# Patient Record
Sex: Female | Born: 1980 | Race: Black or African American | Hispanic: No | Marital: Single | Smoking: Never smoker
Health system: Southern US, Community
[De-identification: ages and names within clinical notes are randomized; demographics above are authoritative.]

## PROBLEM LIST (undated history)

## (undated) HISTORY — PX: FOOT SURGERY: SHX648

---
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
## 2000-09-16 ENCOUNTER — Emergency Department (HOSPITAL_COMMUNITY): Admission: EM | Admit: 2000-09-16 | Discharge: 2000-09-16 | Payer: Self-pay

## 2001-10-07 ENCOUNTER — Inpatient Hospital Stay (HOSPITAL_COMMUNITY): Admission: AD | Admit: 2001-10-07 | Discharge: 2001-10-12 | Payer: Self-pay | Admitting: Obstetrics and Gynecology

## 2001-10-07 ENCOUNTER — Encounter: Payer: Self-pay | Admitting: Emergency Medicine

## 2001-10-08 ENCOUNTER — Encounter: Payer: Self-pay | Admitting: Obstetrics and Gynecology

## 2001-10-12 ENCOUNTER — Inpatient Hospital Stay (HOSPITAL_COMMUNITY): Admission: AD | Admit: 2001-10-12 | Discharge: 2001-10-15 | Payer: Self-pay | Admitting: Obstetrics and Gynecology

## 2002-10-25 ENCOUNTER — Emergency Department (HOSPITAL_COMMUNITY): Admission: EM | Admit: 2002-10-25 | Discharge: 2002-10-25 | Payer: Self-pay | Admitting: Emergency Medicine

## 2002-11-20 ENCOUNTER — Emergency Department (HOSPITAL_COMMUNITY): Admission: EM | Admit: 2002-11-20 | Discharge: 2002-11-20 | Payer: Self-pay | Admitting: Emergency Medicine

## 2005-06-07 ENCOUNTER — Emergency Department (HOSPITAL_COMMUNITY): Admission: EM | Admit: 2005-06-07 | Discharge: 2005-06-07 | Payer: Self-pay | Admitting: Emergency Medicine

## 2006-06-22 ENCOUNTER — Ambulatory Visit: Payer: Self-pay | Admitting: Family Medicine

## 2006-09-01 ENCOUNTER — Emergency Department (HOSPITAL_COMMUNITY): Admission: EM | Admit: 2006-09-01 | Discharge: 2006-09-02 | Payer: Self-pay | Admitting: Emergency Medicine

## 2007-01-15 ENCOUNTER — Ambulatory Visit: Payer: Self-pay | Admitting: Family Medicine

## 2007-01-30 ENCOUNTER — Emergency Department (HOSPITAL_COMMUNITY): Admission: EM | Admit: 2007-01-30 | Discharge: 2007-01-30 | Payer: Self-pay | Admitting: Emergency Medicine

## 2007-02-02 ENCOUNTER — Ambulatory Visit (HOSPITAL_BASED_OUTPATIENT_CLINIC_OR_DEPARTMENT_OTHER): Admission: RE | Admit: 2007-02-02 | Discharge: 2007-02-02 | Payer: Self-pay | Admitting: Orthopedic Surgery

## 2007-08-27 ENCOUNTER — Ambulatory Visit: Payer: Self-pay | Admitting: Family Medicine

## 2008-02-08 ENCOUNTER — Ambulatory Visit: Payer: Self-pay | Admitting: Family Medicine

## 2008-05-18 ENCOUNTER — Ambulatory Visit: Payer: Self-pay | Admitting: Family Medicine

## 2008-08-08 ENCOUNTER — Ambulatory Visit: Payer: Self-pay | Admitting: Family Medicine

## 2008-09-02 ENCOUNTER — Encounter: Admission: RE | Admit: 2008-09-02 | Discharge: 2008-09-02 | Payer: Self-pay | Admitting: Orthopedic Surgery

## 2009-01-10 ENCOUNTER — Emergency Department (HOSPITAL_COMMUNITY): Admission: EM | Admit: 2009-01-10 | Discharge: 2009-01-10 | Payer: Self-pay | Admitting: Emergency Medicine

## 2010-06-25 NOTE — Op Note (Signed)
[name], [name]                 ACCOUNT NO.:  [id number]   MEDICAL RECORD NO.:  [id number]          PATIENT TYPE:  AMB   LOCATION:  [facility]                         FACILITY:  [facility]   PHYSICIAN:  Marlowe Kays, M.D.  DATE OF BIRTH:  07/29/1980   DATE OF PROCEDURE:  02/02/2007  DATE OF DISCHARGE:  01/30/2007                               OPERATIVE REPORT   PREOPERATIVE DIAGNOSIS:  Painful growing mass, right second toe.   POSTOPERATIVE DIAGNOSIS:  Cyst of flexor tendon sheath, right second  toe.   OPERATION:  Excision of cyst flexor tendon sheath, right second toe.   SURGEON:  Marlowe Kays, M.D.   ASSISTANTDruscilla Brownie. Underwood, P.A.-C.   ANESTHESIA:  General.   JUSTIFICATION FOR PROCEDURE:  This mass has been present for at least a  month and is progressively growing and painful.  X-rays are  unremarkable.  Differential diagnosis was cyst versus possible giant  cell tumor.  Mr. Angie Fava services were utilized because of the  patient's massive obesity and the need for two pairs of hands for  retraction, with one being the scrub nurse.   DESCRIPTION OF PROCEDURE:  Satisfactory general anesthesia, left leg was  supported with taping and an OR table.  The right leg had an Esmarch  applied with the tourniquet inflated to 400 mmHg.  The foot and ankle  were prepped with DuraPrep and draped in a sterile field.  A time out  was performed.  I made a triangular shaped incision with the apex of the  triangle centered at the DIP joint on the medial side and the two arms  of the triangle lateral proximal and distal.  Working in the midline on  the more medial side, I was able to work through the subcutaneous tissue  and find the mass, which was a cyst.  I dissected this off the  underlying flexor tendon with a combination of sharp dissection and a  Therapist, nutritional, incising it at its base.  It was clearly benign, I did  not send it to pathology.  The small defect in the flexor  tendon sheath  was left open.  There was no harm to the tendon and the neurovascular  structures were protected to either side.  I blocked the toe with 0.5%  plain Marcaine and closed the skin and subcutaneous tissue as a unit  with interrupted 4-0 nylon sutures.  Betadine and Adaptic dry, sterile  dressing were applied, the tourniquet was released, she tolerated the  procedure well and was taken to the recovery room in satisfactory  condition with no complications.           ______________________________  Marlowe Kays, M.D.     JA/MEDQ  D:  02/02/2007  T:  02/02/2007  Job:  [id number]

## 2010-06-28 NOTE — Discharge Summary (Signed)
NAMESAMAH, LAPIANA                             ACCOUNT NO.:  [id number]   MEDICAL RECORD NO.:  [id number]                   PATIENT TYPE:   LOCATION:                                       FACILITY:   PHYSICIAN:  Conni Elliot, M.D.             DATE OF BIRTH:  21-Nov-1980   DATE OF ADMISSION:  10/12/2001  DATE OF DISCHARGE:  10/15/2001                                 DISCHARGE SUMMARY   BRIEF ADMISSION HISTORY AND PHYSICAL:  The patient is a 30 year old G1, P1  who was postoperative day number three after a low transverse cesarean  section with preeclampsia who presented to [facility] on the day of admission after  being discharged earlier that day with complaints of severe frontal headache  and dizziness.  She denied any visual changes.  On examination she was found  to be afebrile with elevated blood pressures of 159/106 and 185/93.  She was  an obese African-American female with a soft, nontender abdomen and incision  that was clean, dry, and intact.  She was found to have 2+ lower extremity  edema, 3+ DTRs, as well as some generalized edema.  Laboratory work showed a  uric acid of 5.5 and AST of 38 which was elevated, a normal ALT, and  elevated LDH of 367.  Platelets were 248,000 and hemoglobin was 7.6.  The  patient was readmitted and placed on magnesium for preeclampsia with  elevated blood pressure with neurologic symptoms as well as the  elevated  labs.   HOSPITAL COURSE:  On hospital day number two which was postoperative day  number four the patient was without any PIH symptoms including headache  which had resolved after her magnesium bolus at admission.  Her blood  pressures ranged from systolics of 140-165 with diastolics ranging from 70s-  90s.  Magnesium level was obtained and found to be 3.6 so her magnesium was  rebolused at 1 g and then increased to 3 g/hour.  She began to have good  diuresis and improvement in her blood pressures and remained asymptomatic.  Her PIH  laboratories showed improvement on hospital day number three which  is postoperative day number five so her magnesium was discontinued.  Her  staples were removed on postoperative day number five.  On hospital day  number four which was postoperative day number six patient remained  asymptomatic with improved blood pressures and PIH laboratories, although  uric acid was slightly elevated at 0.3.  The patient felt much improved and  was deemed ready for discharge.   DISCHARGE MEDICATIONS:  Included the medications she had been prescribed at  her previous discharge which were iron sulfate, Percocet, Motrin, prenatal  vitamins, and Micronor.  She was also given a prescription for Colace 100 mg  p.o. b.i.d. for constipation.   DISCHARGE INSTRUCTIONS:  She was instructed to keep her incisional wound  clean and dry, to refrain  from sexual activity for six weeks, and to follow  up with [facility] in six weeks.  She is also instructed to return to  [facility] if she developed headache, blurry vision, abdominal pain,  or developed problems with her abdominal incision.     Georgina Peer, M.D.                 Conni Elliot, M.D.    JM/MEDQ  D:  12/14/2001  T:  12/15/2001  Job:  [id number]

## 2010-06-28 NOTE — Op Note (Signed)
[name], [name]                           ACCOUNT NO.:  [id number]   MEDICAL RECORD NO.:  [id number]                   PATIENT TYPE:  INP   LOCATION:  9110                                 FACILITY:  [facility]   PHYSICIAN:  Enid Cutter, M.D.                  DATE OF BIRTH:  February 17, 1980   DATE OF PROCEDURE:  10/08/2001  DATE OF DISCHARGE:                                 OPERATIVE REPORT   PREOPERATIVE DIAGNOSIS:  [age]-year-old G1, P0 at term with arrest of  dilatation.   POSTOPERATIVE DIAGNOSIS:  [age]-year-old G1, P0 at term with arrest of  dilatation.   PROCEDURE:  Primary low transverse cesarean section.   SURGEON:  Enid Cutter, M.D.   ANESTHESIA:  Epidural.   COMPLICATIONS:  None.   SPECIMENS:  None.   DISPOSITION:  To recovery room stable.   ESTIMATED BLOOD LOSS:  1000 cc.   FINDINGS:  Viable female infant delivered at [address]16 on October 07, 2001, with  Apgars of 8 and 9,  weighing 7 pounds 7 ounces.   COUNTS:  Sponge, needle and instrument counts were correct at the end of the  procedure.   INDICATIONS FOR PROCEDURE:  The patient is a 30 year old gravida 1 who  presents to [facility] on October 07, 2001.  She did not know that  she was pregnant at that time.  Subsequent examination, pregnancy test and  ultrasound revealed that she was approximately [redacted] weeks pregnant.  She had  spontaneous rupture of membranes at approximately 10 a.m. on October 07, 2001.  She was transferred to [facility] where induction of labor was  begun.  She was managed by [facility] and Gynecology until they  transferred her to the teaching service.  She was then continued on  induction and was begun on Pitocin throughout the day.  She progressed to 4  cm.  She had approximately 290 NPU, however, did not change her cervix past  4 cm over greater than three hours.  Decision was made to proceed with  primary low transverse cesarean section secondary to arrest  of labor.  The  patient was counseled on her risks of surgery including the risks of  bleeding, infection, injury to internal organs, risks of transfusion or  emergency hysterectomy.  The patient understands the risks and desires to  proceed.   DESCRIPTION OF PROCEDURE:  The patient was taken to the operating room where  she was given epidural anesthesia bolus.  She was prepped and draped in a  sterile fashion.  A Pfannenstiel incision was performed and carried down to  the underlying fascia with a scalpel.  The fascia was then entered with the  Bovie and dissected laterally with Mayo scissors.  The fascia was separated  from the underlying rectus muscle bellies, and the peritoneum was entered  sharply.  The bladder flap was created  with sharp and blunt dissection.  The  uterus was scored with the scalpel and carried down to the midline.  The  uterus was entered, and the incision was extended laterally.  The infant's  head was delivered from deep in the pelvis in a transverse presentation.  The infant was bulb suctioned on the operative field, and the body was  delivered.  The cord was clamped and cut, and the infant was handed off to  the awaiting neonatal resuscitation team.  The placenta was manually  extracted.  The uterus was externalized and curetted with a dry lap sponge.  The uterine incision was repaired with a running locking suture of 0  chromic.  There was a small extension on the patient's right corner downward  with delivery of the head.  This extension was continued further, and repair  of this extension was _______with the same stitch.  The cervix and uterus  were noted to be hemostatic, and the uterus was returned to the abdominal  cavity.  Pericolic gutters were emptied of clot and debris, and the uterine  incision was inspected again and noted to be hemostatic.  The fascia was  closed with a running suture of 0 Vicryl.  The subcutaneous tissues were  irrigated copious  amounts of saline, and the skin edges were reapproximated  with skin clips.                                               Enid Cutter, M.D.    EMH/MEDQ  D:  10/09/2001  T:  10/11/2001  Job:  [id number]

## 2010-06-28 NOTE — Discharge Summary (Signed)
NAME:  CHEA, MALAN                           ACCOUNT NO.:  [id number]   MEDICAL RECORD NO.:  [id number]                   PATIENT TYPE:  INP   LOCATION:  9110                                 FACILITY:  [facility]   PHYSICIAN:  Phil D. Okey Dupre, M.D.                  DATE OF BIRTH:  Feb 19, 1980   DATE OF ADMISSION:  10/07/2001  DATE OF DISCHARGE:                                 DISCHARGE SUMMARY   PRIMARY PHYSICIAN:  [facility].   DISCHARGE DIAGNOSES:  1. Status post primary low transverse cesarean section secondary to failure     to dilate.  2. Pregnancy induced hypertension status post magnesium sulfate.  3. Postpartum anemia.  4. Status post delivery of a viable female infant at approximately 37-[redacted] weeks     gestation.   DISCHARGE MEDICATIONS:  1. Ibuprofen 600 mg q.6h. p.r.n. pain.  2. Percocet 5/325 p.o. q.4-6h. p.r.n. severe pain.  3. Micronor starting Sunday, October 24, 2001 - one tablet p.o. q.d. at     the same time q.d.  4. Iron sulfate 325 mg p.o. t.i.d. with meals.  5. Prenatal vitamins one p.o. q.d. x6 weeks or while breast feeding.   FOLLOW-UP:  The patient is to follow up with [facility] at six weeks.   PROCEDURES AND DIAGNOSTIC STUDIES:  Primary LTCS on October 09, 2001 with  subsequent delivery of a viable female infant at 80 on October 09, 2001.   CONSULTATIONS:  None.   ADMISSION HISTORY AND PHYSICAL:  A 30 year old G1 P0 African-American female  at 31-[redacted] weeks gestation who had not previously been diagnosed with  pregnancy who presented with pelvic pressure and a blood pressure of  166/124.  On exam the patient was found to have positive ferning, pooling,  and nitrazine, and on cervical exam was fingertip, 70%, -2, and vertex.  Prenatal labs were obtained as well as a PIH panel which was relatively  within normal limits except for a uric acid of 5.0; negative wet prep on  admission.  Stadol and Phenergan were given for headache and an OB  ultrasound was  obtained.   HOSPITAL COURSE:  The patient progressed through labor, did have an IUPC  placed for low-dose Pitocin augmentation.  The patient was treated with  Stadol and Phenergan for pain which did resolve her headache but did not  help with her contraction pain significantly.  The patient did dilate to 4  cm but did not progress further than that.  The patient was prolonged  rupture at greater than 18 hours, was started on penicillin and then changed  to Unasyn for better antibiotic coverage.  The patient did develop elevated  blood pressures throughout her hospitalization which resulted in magnesium  sulfate for probable PIH.  The patient was taken to the OR and did have a  primary LTCS without difficulty.  The patient had routine postoperative  care.  The only difficulty in the postoperative period was anemia for which  she was treated with iron sulfate, she was not symptomatic, and therefore  was determined to be appropriate for discharge without a transfusion.   DISCHARGE LABORATORY DATA:  Wbc's 11.0, hemoglobin 7.0, hematocrit 21.6,  platelets 210.  Sodium 138, potassium 3.4, chloride 107, CO2 27, glucose 68,  BUN 5, creatinine 0.7, calcium 8.0, total protein 5.9, albumin 2.5, AST 12,  ALT less than 19, alkaline phosphatase 132, total bilirubin 0.1, LDH 212,  magnesium 4.5 on August 31, uric acid 6.4.  Hepatitis B surface antigen  negative.  A positive, antibody negative.  GBS positive.  RPR negative.  GC  negative, chlamydia negative.  Rubella immune.   OB ultrasound on October 08, 2001 showed single IUP, cephalic in position,  low AFI.   DISPOSITION:  The patient was discharged to home on postoperative day #3  without further difficulty.     Jonah Blue, M.D.                      Phil D. Okey Dupre, M.D.    Milas Gain  D:  10/12/2001  T:  10/12/2001  Job:  [id number]   cc:   [facility]

## 2010-11-15 LAB — POCT PREGNANCY, URINE
Operator id: 280881
Preg Test, Ur: NEGATIVE

## 2010-11-15 LAB — POCT HEMOGLOBIN-HEMACUE
Hemoglobin: 10.1 — ABNORMAL LOW
Operator id: 280881

## 2011-01-07 ENCOUNTER — Emergency Department (INDEPENDENT_AMBULATORY_CARE_PROVIDER_SITE_OTHER)

## 2011-01-07 ENCOUNTER — Encounter: Payer: Self-pay | Admitting: Emergency Medicine

## 2011-01-07 ENCOUNTER — Emergency Department (HOSPITAL_BASED_OUTPATIENT_CLINIC_OR_DEPARTMENT_OTHER)
Admission: EM | Admit: 2011-01-07 | Discharge: 2011-01-08 | Disposition: A | Discharge status: Home / Self Care | Attending: Emergency Medicine | Admitting: Emergency Medicine

## 2011-01-07 DIAGNOSIS — S4980XA Other specified injuries of shoulder and upper arm, unspecified arm, initial encounter: Secondary | ICD-10-CM | POA: Insufficient documentation

## 2011-01-07 DIAGNOSIS — M25529 Pain in unspecified elbow: Secondary | ICD-10-CM

## 2011-01-07 DIAGNOSIS — X58XXXA Exposure to other specified factors, initial encounter: Secondary | ICD-10-CM

## 2011-01-07 DIAGNOSIS — M79609 Pain in unspecified limb: Secondary | ICD-10-CM

## 2011-01-07 DIAGNOSIS — S59909A Unspecified injury of unspecified elbow, initial encounter: Secondary | ICD-10-CM

## 2011-01-07 DIAGNOSIS — Y9289 Other specified places as the place of occurrence of the external cause: Secondary | ICD-10-CM | POA: Insufficient documentation

## 2011-01-07 DIAGNOSIS — IMO0002 Reserved for concepts with insufficient information to code with codable children: Secondary | ICD-10-CM | POA: Insufficient documentation

## 2011-01-07 DIAGNOSIS — S4990XA Unspecified injury of shoulder and upper arm, unspecified arm, initial encounter: Secondary | ICD-10-CM

## 2011-01-07 DIAGNOSIS — S46909A Unspecified injury of unspecified muscle, fascia and tendon at shoulder and upper arm level, unspecified arm, initial encounter: Secondary | ICD-10-CM | POA: Insufficient documentation

## 2011-01-07 DIAGNOSIS — S6990XA Unspecified injury of unspecified wrist, hand and finger(s), initial encounter: Secondary | ICD-10-CM

## 2011-01-07 MED ORDER — HYDROCODONE-ACETAMINOPHEN 5-325 MG PO TABS
2.0000 | ORAL_TABLET | Freq: Once | ORAL | Status: AC
  Administered 2011-01-07: 2 via ORAL
  Filled 2011-01-07: qty 2

## 2011-01-07 MED ORDER — HYDROCODONE-ACETAMINOPHEN 5-325 MG PO TABS: 2.0000 | ORAL_TABLET | ORAL | Status: AC | PRN

## 2011-01-07 NOTE — ED Notes (Signed)
Pt c/o left forearm pain after being hit with a piece of equipment at work. Pt has ice on left arm.

## 2011-01-07 NOTE — ED Provider Notes (Signed)
History     CSN: [id number] Arrival date & time: 01/07/2011 10:07 PM   First MD Initiated Contact with Patient 01/07/11 2211      Chief Complaint  Patient presents with  . Arm Injury    (Consider location/radiation/quality/duration/timing/severity/associated sxs/prior treatment) HPI Comments: Patient presents with left forearm and elbow pain after having it closed in a piece of machinery at work.  She states any type of door came down on her left proximal forearm.  His pain it radiates in the entire arm and with flexion of the elbow joint. There is no breaks in the skin she denies any weakness, numbness, tingling. She has full sensation range of motion of her hand.  The history is provided by the patient.    History reviewed. No pertinent past medical history.  Past Surgical History  Procedure Date  . Cesarean section   . Foot surgery     No family history on file.  History  Substance Use Topics  . Smoking status: Never Smoker   . Smokeless tobacco: Not on file  . Alcohol Use: Yes    OB History    Grav Para Term Preterm Abortions TAB SAB Ect Mult Living                  Review of Systems  All other systems reviewed and are negative.    Allergies  Review of patient's allergies indicates no known allergies.  Home Medications   Current Outpatient Rx  Name Route Sig Dispense Refill  . HYDROCODONE-ACETAMINOPHEN 5-325 MG PO TABS Oral Take 2 tablets by mouth every 4 (four) hours as needed for pain. 10 tablet 0    BP 135/74  Pulse 97  Temp(Src) 97.6 F (36.4 C) (Oral)  Resp 18  SpO2 100%  Physical Exam  Constitutional: She is oriented to person, place, and time. She appears well-developed and well-nourished. No distress.  HENT:  Head: Normocephalic and atraumatic.  Mouth/Throat: Oropharynx is clear and moist. No oropharyngeal exudate.  Neck: Normal range of motion.  Pulmonary/Chest: No respiratory distress.  Abdominal: Soft.  Musculoskeletal: Normal  range of motion. She exhibits tenderness.       Erythema and tenderness to the dorsal proximal left forearm. +2 radial pulse. There is a ganglion cyst over the palm are radial wrist.  Cardinal hand movements are intact. She has limited range of motion the left elbow joint is unable to flex it completely.  She does have pain with pronation, supination  Neurological: She is alert and oriented to person, place, and time. No cranial nerve deficit.  Skin: Skin is warm.    ED Course  Procedures (including critical care time)  Labs Reviewed - No data to display Dg Elbow Complete Left  01/07/2011  *RADIOLOGY REPORT*  Clinical Data: Left elbow injury with pain.  LEFT ELBOW - COMPLETE 3+ VIEW  Comparison:  None.  Findings:  There is no evidence of fracture, dislocation, or joint effusion.  There is no evidence of arthropathy or other focal bone abnormality.  Soft tissues are unremarkable.  IMPRESSION: Negative.  Original Report Authenticated By: Reola Calkins, M.D.   Dg Forearm Left  01/07/2011  *RADIOLOGY REPORT*  Clinical Data: Left forearm pain, after injury from equipment at work.  LEFT FOREARM - 2 VIEW  Comparison: Left wrist MRI performed 09/02/2008  Findings: There is no evidence of fracture or dislocation.  The radius and ulna appear intact.  The elbow joint is unremarkable in appearance, without evidence of elbow  joint effusion.  The carpal rows appear grossly intact, and demonstrate normal alignment; the scaphoid is not well assessed due to positioning.  No definite soft tissue abnormalities are characterized on radiograph.  IMPRESSION: No evidence of fracture or dislocation.  Original Report Authenticated By: Tonia Ghent, M.D.     1. Arm injury       MDM  L forearm and elbow injury.  No breaks in skin, neurovascularly intact.  Xrays negative for fracture.  Will place sling for comfort.       Glynn Octave, MD 01/08/11 [phone]

## 2015-03-19 ENCOUNTER — Emergency Department (HOSPITAL_BASED_OUTPATIENT_CLINIC_OR_DEPARTMENT_OTHER)
Admission: EM | Admit: 2015-03-19 | Discharge: 2015-03-20 | Disposition: A | Discharge status: Home / Self Care | Payer: Federal, State, Local not specified - PPO | Attending: Emergency Medicine | Admitting: Emergency Medicine

## 2015-03-19 ENCOUNTER — Encounter (HOSPITAL_BASED_OUTPATIENT_CLINIC_OR_DEPARTMENT_OTHER): Payer: Self-pay | Admitting: Emergency Medicine

## 2015-03-19 DIAGNOSIS — R5383 Other fatigue: Secondary | ICD-10-CM | POA: Insufficient documentation

## 2015-03-19 DIAGNOSIS — Y658 Other specified misadventures during surgical and medical care: Secondary | ICD-10-CM | POA: Insufficient documentation

## 2015-03-19 DIAGNOSIS — R51 Headache: Secondary | ICD-10-CM | POA: Insufficient documentation

## 2015-03-19 DIAGNOSIS — T8383XA Hemorrhage of genitourinary prosthetic devices, implants and grafts, initial encounter: Secondary | ICD-10-CM | POA: Insufficient documentation

## 2015-03-19 DIAGNOSIS — R42 Dizziness and giddiness: Secondary | ICD-10-CM | POA: Insufficient documentation

## 2015-03-19 DIAGNOSIS — T8389XA Other specified complication of genitourinary prosthetic devices, implants and grafts, initial encounter: Secondary | ICD-10-CM

## 2015-03-19 DIAGNOSIS — N920 Excessive and frequent menstruation with regular cycle: Secondary | ICD-10-CM

## 2015-03-19 DIAGNOSIS — D509 Iron deficiency anemia, unspecified: Secondary | ICD-10-CM | POA: Diagnosis not present

## 2015-03-19 LAB — CBC
HEMATOCRIT: 23.9 % — AB (ref 36.0–46.0)
HEMOGLOBIN: 6.3 g/dL — AB (ref 12.0–15.0)
MCH: 17.4 pg — ABNORMAL LOW (ref 26.0–34.0)
MCHC: 26.4 g/dL — ABNORMAL LOW (ref 30.0–36.0)
MCV: 65.8 fL — AB (ref 78.0–100.0)
Platelets: 303 10*3/uL (ref 150–400)
RBC: 3.63 MIL/uL — ABNORMAL LOW (ref 3.87–5.11)
RDW: 18.9 % — ABNORMAL HIGH (ref 11.5–15.5)
WBC: 10.8 10*3/uL — AB (ref 4.0–10.5)

## 2015-03-19 LAB — BASIC METABOLIC PANEL
ANION GAP: 9 (ref 5–15)
BUN: 24 mg/dL — ABNORMAL HIGH (ref 6–20)
CHLORIDE: 108 mmol/L (ref 101–111)
CO2: 23 mmol/L (ref 22–32)
Calcium: 9.3 mg/dL (ref 8.9–10.3)
Creatinine, Ser: 0.84 mg/dL (ref 0.44–1.00)
GFR calc Af Amer: >60 mL/min (ref >60)
GLUCOSE: 97 mg/dL (ref 65–99)
POTASSIUM: 3.8 mmol/L (ref 3.5–5.1)
SODIUM: 140 mmol/L (ref 135–145)

## 2015-03-19 LAB — CBG MONITORING, ED
GLUCOSE-CAPILLARY: 82 mg/dL (ref 65–99)
Glucose-Capillary: 93 mg/dL (ref 65–99)

## 2015-03-19 NOTE — ED Notes (Signed)
Pt ambulatory to bathroom. Specimen to be collected.

## 2015-03-19 NOTE — ED Notes (Signed)
MD at bedside. 

## 2015-03-19 NOTE — ED Provider Notes (Signed)
CSN: [id number]     Arrival date & time 03/19/15  2108 History  By signing my name below, I, Tanda Rockers, attest that this documentation has been prepared under the direction and in the presence of Dione Booze, MD. Electronically Signed: Tanda Rockers, ED Scribe. 03/19/2015. 11:20 PM.   Chief Complaint  Patient presents with  . Dizziness   The history is provided by the patient. No language interpreter was used.     HPI Comments: [name] is a 35 y.o. female who presents to the Emergency Department complaining of gradual onset, constant, dizziness/lightheadedness that began about an hour PTA. Pt states that she broke out into a cold sweat as well. She mentions having a headache earlier while in the waiting room that has since resolved on its own. Pt reports that for the past week she has been more fatigued than usual. She mentions that she was told she was anemic when she had her son 14 years ago and was placed on iron at that time but has not been on it since then. Pt denies heavy menstrual cycles but states they last a long time. Denies nausea, vomiting, fever, chills, melena, or any other associated symptoms. Pt is non smoker and occasional EtOH drinker.   PCP - Susann Givens  History reviewed. No pertinent past medical history. Past Surgical History  Procedure Laterality Date  . Cesarean section    . Foot surgery     History reviewed. No pertinent family history. Social History  Substance Use Topics  . Smoking status: Never Smoker   . Smokeless tobacco: None  . Alcohol Use: Yes   OB History    No data available     Review of Systems  Constitutional: Positive for fatigue. Negative for fever and chills.  Gastrointestinal: Negative for nausea and vomiting.  Neurological: Positive for dizziness, light-headedness and headaches.  All other systems reviewed and are negative.  Allergies  Review of patient's allergies indicates no known allergies.  Home Medications   Prior to  Admission medications   Not on File   BP 94/68 mmHg  Pulse 96  Temp(Src) 97.9 F (36.6 C) (Oral)  Resp 18  Ht  (1.753 m)  Wt 279 lb (126.554 kg)  BMI 41.18 kg/m2  SpO2 98%  LMP 03/08/2015   Physical Exam  Constitutional: She is oriented to person, place, and time. She appears well-developed and well-nourished. No distress.  HENT:  Head: Normocephalic and atraumatic.  Eyes: EOM are normal.  Conjunctiva are pale  Neck: Neck supple. No tracheal deviation present.  Cardiovascular: Normal rate.   Pulmonary/Chest: Effort normal. No respiratory distress.  Musculoskeletal: Normal range of motion.  Neurological: She is alert and oriented to person, place, and time.  Skin: Skin is warm and dry.  Psychiatric: She has a normal mood and affect. Her behavior is normal.  Nursing note and vitals reviewed.   ED Course  Procedures (including critical care time)  DIAGNOSTIC STUDIES: Oxygen Saturation is 98% on RA, normal by my interpretation.    COORDINATION OF CARE: 11:18 PM-Discussed treatment plan which includes occult blood and orthostatic vital signs with pt at bedside and pt agreed to plan.    Labs Review Results for orders placed or performed during the hospital encounter of 03/19/15  Basic metabolic panel  Result Value Ref Range   Sodium 140 135 - 145 mmol/L   Potassium 3.8 3.5 - 5.1 mmol/L   Chloride 108 101 - 111 mmol/L   CO2 23 22 -  32 mmol/L   Glucose, Bld 97 65 - 99 mg/dL   BUN 24 (H) 6 - 20 mg/dL   Creatinine, Ser 1.61 0.44 - 1.00 mg/dL   Calcium 9.3 8.9 - 09.6 mg/dL   GFR calc non Af Amer >60 >60 mL/min   GFR calc Af Amer >60 >60 mL/min   Anion gap 9 5 - 15  CBC  Result Value Ref Range   WBC 10.8 (H) 4.0 - 10.5 K/uL   RBC 3.63 (L) 3.87 - 5.11 MIL/uL   Hemoglobin 6.3 (LL) 12.0 - 15.0 g/dL   HCT 04.5 (L) 40.9 - 81.1 %   MCV 65.8 (L) 78.0 - 100.0 fL   MCH 17.4 (L) 26.0 - 34.0 pg   MCHC 26.4 (L) 30.0 - 36.0 g/dL   RDW 91.4 (H) 78.2 - 95.6 %   Platelets  303 150 - 400 K/uL  Urinalysis, Routine w reflex microscopic (not at [facility])  Result Value Ref Range   Color, Urine YELLOW YELLOW   APPearance CLOUDY (A) CLEAR   Specific Gravity, Urine 1.029 1.005 - 1.030   pH 5.5 5.0 - 8.0   Glucose, UA NEGATIVE NEGATIVE mg/dL   Hgb urine dipstick NEGATIVE NEGATIVE   Bilirubin Urine NEGATIVE NEGATIVE   Ketones, ur NEGATIVE NEGATIVE mg/dL   Protein, ur NEGATIVE NEGATIVE mg/dL   Nitrite NEGATIVE NEGATIVE   Leukocytes, UA SMALL (A) NEGATIVE  Urine microscopic-add on  Result Value Ref Range   Squamous Epithelial / LPF 0-5 (A) NONE SEEN   WBC, UA 0-5 0 - 5 WBC/hpf   RBC / HPF 0-5 0 - 5 RBC/hpf   Bacteria, UA FEW (A) NONE SEEN   Casts HYALINE CASTS (A) NEGATIVE   Urine-Other TRICHOMONAS PRESENT   Occult blood card to lab, stool  Result Value Ref Range   Fecal Occult Bld NEGATIVE NEGATIVE  Reticulocytes  Result Value Ref Range   Retic Ct Pct 1.4 0.4 - 3.1 %   RBC. 3.63 (L) 3.87 - 5.11 MIL/uL   Retic Count, Manual 50.8 19.0 - 186.0 K/uL  CBG monitoring, ED  Result Value Ref Range   Glucose-Capillary 93 65 - 99 mg/dL  CBG monitoring, ED  Result Value Ref Range   Glucose-Capillary 82 65 - 99 mg/dL   Comment 1 Notify RN    Comment 2 Document in Chart    I have personally reviewed and evaluated these lab results as part of my medical decision-making.   EKG Interpretation   Date/Time:  Monday March 19 2015 21:45:34 EST Ventricular Rate:  103 PR Interval:  142 QRS Duration: 82 QT Interval:  336 QTC Calculation: 440 R Axis:   74 Text Interpretation:  Sinus tachycardia Otherwise normal ECG No old  tracing to compare Confirmed by [facility]  MD, Zyonna Vardaman ([id number]) on 03/20/2015  12:42:54 AM      MDM   Final diagnoses:  Dizziness  Microcytic anemia  Menorrhagia due to intrauterine device (IUD) (HCC)    Lightheadedness of uncertain cause. She is noted to have significant anemia with almost a 4 g drop compared with last hemoglobin on record in  2008. On further questioning, patient states that she had an IUD placed 2 years ago and menses started lasting 7 days instead of 3 days. Stool was tested and is Hemoccult negative. She has no significant orthostatic pulse or blood pressure change. Anemia appears to be chronic and unrelated to the lightheadedness that she had. Anemia panel is drawn and she is discharged with prescription for  ferrous sulfate and she is to follow-up with her PCP in one week.  I personally performed the services described in this documentation, which was scribed in my presence. The recorded information has been reviewed and is accurate.         Dione Booze, MD 03/20/15 [phone]

## 2015-03-19 NOTE — ED Notes (Signed)
Pt placed on auto vitals Q30.  

## 2015-03-19 NOTE — ED Notes (Addendum)
Patient states that about an hour ago she started to have chills and felt lightheaded and nauseated. The patient reports that she feels better now but has a Headache. Patient is acting really tired in triage, states that she is really tired and could take a nap. Patient yawning a lot and very quiet when answering questions. Pupils equal, and large 4 mm

## 2015-03-19 NOTE — ED Notes (Signed)
Attempt by EMT for blood draw with line. Will return to collect labs.

## 2015-03-20 LAB — FERRITIN: FERRITIN: 2 ng/mL — AB (ref 11–307)

## 2015-03-20 LAB — URINE MICROSCOPIC-ADD ON

## 2015-03-20 LAB — URINALYSIS, ROUTINE W REFLEX MICROSCOPIC
Bilirubin Urine: NEGATIVE
GLUCOSE, UA: NEGATIVE mg/dL
Hgb urine dipstick: NEGATIVE
Ketones, ur: NEGATIVE mg/dL
NITRITE: NEGATIVE
PH: 5.5 (ref 5.0–8.0)
Protein, ur: NEGATIVE mg/dL
SPECIFIC GRAVITY, URINE: 1.029 (ref 1.005–1.030)

## 2015-03-20 LAB — FOLATE: Folate: 11.1 ng/mL (ref >5.9)

## 2015-03-20 LAB — RETICULOCYTES
RBC.: 3.63 MIL/uL — ABNORMAL LOW (ref 3.87–5.11)
RETIC CT PCT: 1.4 % (ref 0.4–3.1)
Retic Count, Absolute: 50.8 10*3/uL (ref 19.0–186.0)

## 2015-03-20 LAB — IRON AND TIBC
IRON: 9 ug/dL — AB (ref 28–170)
SATURATION RATIOS: 2 % — AB (ref 10.4–31.8)
TIBC: 470 ug/dL — AB (ref 250–450)
UIBC: 461 ug/dL

## 2015-03-20 LAB — OCCULT BLOOD X 1 CARD TO LAB, STOOL: Fecal Occult Bld: NEGATIVE

## 2015-03-20 LAB — VITAMIN B12: Vitamin B-12: 135 pg/mL — ABNORMAL LOW (ref 180–914)

## 2015-03-20 MED ORDER — FERROUS SULFATE 325 (65 FE) MG PO TABS: 325.0000 mg | ORAL_TABLET | Freq: Three times a day (TID) | ORAL | Status: AC

## 2015-03-20 NOTE — Discharge Instructions (Signed)
Dizziness Dizziness is a common problem. It is a feeling of unsteadiness or light-headedness. You may feel like you are about to faint. Dizziness can lead to injury if you stumble or fall. Anyone can become dizzy, but dizziness is more common in older adults. This condition can be caused by a number of things, including medicines, dehydration, or illness. HOME CARE INSTRUCTIONS Taking these steps may help with your condition: Eating and Drinking  Drink enough fluid to keep your urine clear or pale yellow. This helps to keep you from becoming dehydrated. Try to drink more clear fluids, such as water.  Do not drink alcohol.  Limit your caffeine intake if directed by your health care provider.  Limit your salt intake if directed by your health care provider. Activity  Avoid making quick movements.  Rise slowly from chairs and steady yourself until you feel okay.  In the morning, first sit up on the side of the bed. When you feel okay, stand slowly while you hold onto something until you know that your balance is fine.  Move your legs often if you need to stand in one place for a long time. Tighten and relax your muscles in your legs while you are standing.  Do not drive or operate heavy machinery if you feel dizzy.  Avoid bending down if you feel dizzy. Place items in your home so that they are easy for you to reach without leaning over. Lifestyle  Do not use any tobacco products, including cigarettes, chewing tobacco, or electronic cigarettes. If you need help quitting, ask your health care provider.  Try to reduce your stress level, such as with yoga or meditation. Talk with your health care provider if you need help. General Instructions  Watch your dizziness for any changes.  Take medicines only as directed by your health care provider. Talk with your health care provider if you think that your dizziness is caused by a medicine that you are taking.  Tell a friend or a family  member that you are feeling dizzy. If he or she notices any changes in your behavior, have this person call your health care provider.  Keep all follow-up visits as directed by your health care provider. This is important. SEEK MEDICAL CARE IF:  Your dizziness does not go away.  Your dizziness or light-headedness gets worse.  You feel nauseous.  You have reduced hearing.  You have new symptoms.  You are unsteady on your feet or you feel like the room is spinning. SEEK IMMEDIATE MEDICAL CARE IF:  You vomit or have diarrhea and are unable to eat or drink anything.  You have problems talking, walking, swallowing, or using your arms, hands, or legs.  You feel generally weak.  You are not thinking clearly or you have trouble forming sentences. It may take a friend or family member to notice this.  You have chest pain, abdominal pain, shortness of breath, or sweating.  Your vision changes.  You notice any bleeding.  You have a headache.  You have neck pain or a stiff neck.  You have a fever.   This information is not intended to replace advice given to you by your health care provider. Make sure you discuss any questions you have with your health care provider.   Document Released: 07/23/2000 Document Revised: 06/13/2014 Document Reviewed: 01/23/2014 Elsevier Interactive Patient Education 2016 [employer].  Iron Deficiency Anemia, Adult Anemia is a condition in which there are less red blood cells or hemoglobin  in the blood than normal. Hemoglobin is the part of red blood cells that carries oxygen. Iron deficiency anemia is anemia caused by too little iron. It is the most common type of anemia. It may leave you tired and short of breath. CAUSES   Lack of iron in the diet.  Poor absorption of iron, as seen with intestinal disorders.  Intestinal bleeding.  Heavy periods. SIGNS AND SYMPTOMS  Mild anemia may not be noticeable. Symptoms may  include:  Fatigue.  Headache.  Pale skin.  Weakness.  Tiredness.  Shortness of breath.  Dizziness.  Cold hands and feet.  Fast or irregular heartbeat. DIAGNOSIS  Diagnosis requires a thorough evaluation and physical exam by your health care provider. Blood tests are generally used to confirm iron deficiency anemia. Additional tests may be done to find the underlying cause of your anemia. These may include:  Testing for blood in the stool (fecal occult blood test).  A procedure to see inside the colon and rectum (colonoscopy).  A procedure to see inside the esophagus and stomach (endoscopy). TREATMENT  Iron deficiency anemia is treated by correcting the cause of the deficiency. Treatment may involve:  Adding iron-rich foods to your diet.  Taking iron supplements. Pregnant or breastfeeding women need to take extra iron because their normal diet usually does not provide the required amount.  Taking vitamins. Vitamin C improves the absorption of iron. Your health care provider may recommend that you take your iron tablets with a glass of orange juice or vitamin C supplement.  Medicines to make heavy menstrual flow lighter.  Surgery. HOME CARE INSTRUCTIONS   Take iron as directed by your health care provider.  If you cannot tolerate taking iron supplements by mouth, talk to your health care provider about taking them through a vein (intravenously) or an injection into a muscle.  For the best iron absorption, iron supplements should be taken on an empty stomach. If you cannot tolerate them on an empty stomach, you may need to take them with food.  Do not drink milk or take antacids at the same time as your iron supplements. Milk and antacids may interfere with the absorption of iron.  Iron supplements can cause constipation. Make sure to include fiber in your diet to prevent constipation. A stool softener may also be recommended.  Take vitamins as directed by your health  care provider.  Eat a diet rich in iron. Foods high in iron include liver, lean beef, whole-grain bread, eggs, dried fruit, and dark green leafy vegetables. SEEK IMMEDIATE MEDICAL CARE IF:   You faint. If this happens, do not drive. Call your local emergency services (911 in [place] if no other help is available.  You have chest pain.  You feel nauseous or vomit.  You have severe or increased shortness of breath with activity.  You feel weak.  You have a rapid heartbeat.  You have unexplained sweating.  You become light-headed when getting up from a chair or bed. MAKE SURE YOU:   Understand these instructions.  Will watch your condition.  Will get help right away if you are not doing well or get worse.   This information is not intended to replace advice given to you by your health care provider. Make sure you discuss any questions you have with your health care provider.   Document Released: 01/25/2000 Document Revised: 02/17/2014 Document Reviewed: 10/04/2012 Elsevier Interactive Patient Education 2016 [employer].  Iron tablets, capsules, extended-release tablets What is this medicine? IRON (  AHY ern) replaces iron that is essential to healthy red blood cells. Iron is used to treat iron deficiency anemia. Anemia may cause problems like tiredness, shortness of breath, or slowed growth in children. Only take iron if your doctor has told you to. Do not treat yourself with iron if you are feeling tired. Most healthy people get enough iron in their diets, particularly if they eat cereals, meat, poultry, and fish. This medicine may be used for other purposes; ask your health care provider or pharmacist if you have questions. What should I tell my health care provider before I take this medicine? They need to know if you have any of these conditions: -frequently drink alcohol -bowel disease -hemolytic anemia -iron overload (hemochromatosis, hemosiderosis) -liver  disease -problems with swallowing -stomach ulcer or other stomach problems -an unusual or allergic reaction to iron, other medicines, foods, dyes, or preservatives -pregnant or trying to get pregnant -breast-feeding How should I use this medicine? Take this medicine by mouth with a glass of water or fruit juice. Follow the directions on the prescription label. Swallow whole. Do not crush or chew. Take this medicine in an upright or sitting position. Try to take any bedtime doses at least 10 minutes before lying down. You may take this medicine with food. Take your medicine at regular intervals. Do not take your medicine more often than directed. Do not stop taking except on your doctor's advice. Talk to your pediatrician regarding the use of this medicine in children. While this drug may be prescribed for selected conditions, precautions do apply. Overdosage: If you think you have taken too much of this medicine contact a poison control center or emergency room at once. NOTE: This medicine is only for you. Do not share this medicine with others. What if I miss a dose? If you miss a dose, take it as soon as you can. If it is almost time for your next dose, take only that dose. Do not take double or extra doses. What may interact with this medicine? If you are taking this iron product, you should not take iron in any other medicine or dietary supplement. This medicine may also interact with the following medications: -alendronate -antacids -cefdinir -chloramphenicol -cholestyramine -deferoxamine -dimercaprol -etidronate -medicines for stomach ulcers or other stomach problems -pancreatic enzymes -quinolone antibiotics (examples: Cipro, Floxin, Levaquin, Tequin and others) -risedronate -tetracycline antibiotics (examples: doxycycline, tetracycline, minocycline, and others) -thyroid hormones This list may not describe all possible interactions. Give your health care provider a list of all  the medicines, herbs, non-prescription drugs, or dietary supplements you use. Also tell them if you smoke, drink alcohol, or use illegal drugs. Some items may interact with your medicine. What should I watch for while using this medicine? Use iron supplements only as directed by your health care professional. Bonita Quin will need important blood work while you are taking this medicine. It may take 3 to 6 months of therapy to treat low iron levels. Pregnant women should follow the dose and length of iron treatment as directed by their doctors. Do not use iron longer than prescribed, and do not take a higher dose than recommended. Long-term use may cause excess iron to build-up in the body. Do not take iron with antacids. If you need to take an antacid, take it 2 hours after a dose of iron. What side effects may I notice from receiving this medicine? Side effects that you should report to your doctor or health care professional as soon as possible: -  allergic reactions like skin rash, itching or hives, swelling of the face, lips, or tongue -blue lips, nails, or palms -dark colored stools (this may be due to the iron, but can indicate a more serious condition) -drowsiness -pain with or difficulty swallowing -pale or clammy skin -seizures -stomach pain -unusually weak or tired -vomiting -weak, fast, or irregular heartbeat Side effects that usually do not require medical attention (report to your doctor or health care professional if they continue or are bothersome): -constipation -indigestion -nausea or stomach upset This list may not describe all possible side effects. Call your doctor for medical advice about side effects. You may report side effects to FDA at 1-800-FDA-1088. Where should I keep my medicine? Keep out of the reach of children. Even small amounts of iron can be harmful to a child. Store at room temperature between 15 and 30 degrees C (59 and 86 degrees F). Keep container tightly closed.  Throw away any unused medicine after the expiration date. NOTE: This sheet is a summary. It may not cover all possible information. If you have questions about this medicine, talk to your doctor, pharmacist, or health care provider.    2016, Elsevier/Gold Standard. (2007-06-15 17:03:41)

## 2015-03-21 ENCOUNTER — Telehealth: Payer: Self-pay | Admitting: Family Medicine

## 2015-03-21 NOTE — Telephone Encounter (Signed)
rcd notice from hospital need follow up appt.  She is not our patient, however, we can take her on.  I call pt reached vm lmtrc we can set her up with Hetty Blend NP

## 2020-01-10 DIAGNOSIS — M1611 Unilateral primary osteoarthritis, right hip: Secondary | ICD-10-CM | POA: Diagnosis not present

## 2020-01-17 DIAGNOSIS — M1611 Unilateral primary osteoarthritis, right hip: Secondary | ICD-10-CM | POA: Diagnosis not present

## 2020-01-26 DIAGNOSIS — Z1389 Encounter for screening for other disorder: Secondary | ICD-10-CM | POA: Diagnosis not present

## 2020-01-26 DIAGNOSIS — D649 Anemia, unspecified: Secondary | ICD-10-CM | POA: Diagnosis not present

## 2020-01-26 DIAGNOSIS — E538 Deficiency of other specified B group vitamins: Secondary | ICD-10-CM | POA: Diagnosis not present

## 2020-01-26 DIAGNOSIS — Z975 Presence of (intrauterine) contraceptive device: Secondary | ICD-10-CM | POA: Diagnosis not present

## 2020-01-26 DIAGNOSIS — Z13220 Encounter for screening for lipoid disorders: Secondary | ICD-10-CM | POA: Diagnosis not present

## 2020-01-26 DIAGNOSIS — Z0000 Encounter for general adult medical examination without abnormal findings: Secondary | ICD-10-CM | POA: Diagnosis not present

## 2020-01-26 DIAGNOSIS — Z862 Personal history of diseases of the blood and blood-forming organs and certain disorders involving the immune mechanism: Secondary | ICD-10-CM | POA: Diagnosis not present

## 2020-01-26 DIAGNOSIS — M1611 Unilateral primary osteoarthritis, right hip: Secondary | ICD-10-CM | POA: Diagnosis not present

## 2020-01-27 ENCOUNTER — Other Ambulatory Visit: Payer: Self-pay | Admitting: Physician Assistant

## 2020-01-27 DIAGNOSIS — Z1231 Encounter for screening mammogram for malignant neoplasm of breast: Secondary | ICD-10-CM

## 2020-01-30 ENCOUNTER — Ambulatory Visit
Admission: RE | Admit: 2020-01-30 | Discharge: 2020-01-30 | Disposition: A | Discharge status: Home / Self Care | Payer: Federal, State, Local not specified - PPO | Source: Ambulatory Visit | Attending: Physician Assistant | Admitting: Physician Assistant

## 2020-01-30 ENCOUNTER — Other Ambulatory Visit: Payer: Self-pay

## 2020-01-30 DIAGNOSIS — Z1231 Encounter for screening mammogram for malignant neoplasm of breast: Secondary | ICD-10-CM

## 2020-03-02 DIAGNOSIS — D649 Anemia, unspecified: Secondary | ICD-10-CM | POA: Diagnosis not present

## 2020-03-02 DIAGNOSIS — R635 Abnormal weight gain: Secondary | ICD-10-CM | POA: Diagnosis not present

## 2020-03-08 DIAGNOSIS — E559 Vitamin D deficiency, unspecified: Secondary | ICD-10-CM | POA: Diagnosis not present

## 2020-03-08 DIAGNOSIS — R000 Tachycardia, unspecified: Secondary | ICD-10-CM | POA: Diagnosis not present

## 2020-03-08 DIAGNOSIS — D500 Iron deficiency anemia secondary to blood loss (chronic): Secondary | ICD-10-CM | POA: Diagnosis not present

## 2020-03-08 DIAGNOSIS — Z6841 Body Mass Index (BMI) 40.0 and over, adult: Secondary | ICD-10-CM | POA: Diagnosis not present

## 2021-07-11 DIAGNOSIS — G4733 Obstructive sleep apnea (adult) (pediatric): Secondary | ICD-10-CM | POA: Diagnosis not present

## 2021-10-09 DIAGNOSIS — M79672 Pain in left foot: Secondary | ICD-10-CM | POA: Diagnosis not present

## 2021-10-11 DIAGNOSIS — G4733 Obstructive sleep apnea (adult) (pediatric): Secondary | ICD-10-CM | POA: Diagnosis not present

## 2021-10-11 DIAGNOSIS — Q6671 Congenital pes cavus, right foot: Secondary | ICD-10-CM | POA: Diagnosis not present

## 2021-10-11 DIAGNOSIS — M7742 Metatarsalgia, left foot: Secondary | ICD-10-CM | POA: Diagnosis not present

## 2021-10-11 DIAGNOSIS — Q6672 Congenital pes cavus, left foot: Secondary | ICD-10-CM | POA: Diagnosis not present

## 2021-11-18 DIAGNOSIS — L578 Other skin changes due to chronic exposure to nonionizing radiation: Secondary | ICD-10-CM | POA: Diagnosis not present

## 2021-11-18 DIAGNOSIS — D485 Neoplasm of uncertain behavior of skin: Secondary | ICD-10-CM | POA: Diagnosis not present

## 2021-11-18 DIAGNOSIS — D0361 Melanoma in situ of right upper limb, including shoulder: Secondary | ICD-10-CM | POA: Diagnosis not present

## 2021-11-18 DIAGNOSIS — D2372 Other benign neoplasm of skin of left lower limb, including hip: Secondary | ICD-10-CM | POA: Diagnosis not present

## 2021-11-18 DIAGNOSIS — Z85820 Personal history of malignant melanoma of skin: Secondary | ICD-10-CM | POA: Diagnosis not present

## 2021-11-18 DIAGNOSIS — Z1283 Encounter for screening for malignant neoplasm of skin: Secondary | ICD-10-CM | POA: Diagnosis not present

## 2021-11-18 DIAGNOSIS — D229 Melanocytic nevi, unspecified: Secondary | ICD-10-CM | POA: Diagnosis not present

## 2021-11-22 DIAGNOSIS — M7742 Metatarsalgia, left foot: Secondary | ICD-10-CM | POA: Diagnosis not present

## 2021-11-22 DIAGNOSIS — M84375S Stress fracture, left foot, sequela: Secondary | ICD-10-CM | POA: Diagnosis not present

## 2021-12-03 DIAGNOSIS — M84375S Stress fracture, left foot, sequela: Secondary | ICD-10-CM | POA: Diagnosis not present

## 2021-12-03 DIAGNOSIS — M7742 Metatarsalgia, left foot: Secondary | ICD-10-CM | POA: Diagnosis not present

## 2021-12-10 DIAGNOSIS — M7742 Metatarsalgia, left foot: Secondary | ICD-10-CM | POA: Diagnosis not present

## 2021-12-11 DIAGNOSIS — L905 Scar conditions and fibrosis of skin: Secondary | ICD-10-CM | POA: Diagnosis not present

## 2021-12-11 DIAGNOSIS — D0361 Melanoma in situ of right upper limb, including shoulder: Secondary | ICD-10-CM | POA: Diagnosis not present

## 2022-01-10 DIAGNOSIS — G4733 Obstructive sleep apnea (adult) (pediatric): Secondary | ICD-10-CM | POA: Diagnosis not present

## 2022-02-24 DIAGNOSIS — M1611 Unilateral primary osteoarthritis, right hip: Secondary | ICD-10-CM | POA: Diagnosis not present

## 2022-05-21 DIAGNOSIS — D2271 Melanocytic nevi of right lower limb, including hip: Secondary | ICD-10-CM | POA: Diagnosis not present

## 2022-05-21 DIAGNOSIS — L578 Other skin changes due to chronic exposure to nonionizing radiation: Secondary | ICD-10-CM | POA: Diagnosis not present

## 2022-05-21 DIAGNOSIS — Z85820 Personal history of malignant melanoma of skin: Secondary | ICD-10-CM | POA: Diagnosis not present

## 2022-05-21 DIAGNOSIS — Z1283 Encounter for screening for malignant neoplasm of skin: Secondary | ICD-10-CM | POA: Diagnosis not present

## 2022-05-21 DIAGNOSIS — D485 Neoplasm of uncertain behavior of skin: Secondary | ICD-10-CM | POA: Diagnosis not present

## 2022-05-21 DIAGNOSIS — D229 Melanocytic nevi, unspecified: Secondary | ICD-10-CM | POA: Diagnosis not present

## 2022-06-16 DIAGNOSIS — L905 Scar conditions and fibrosis of skin: Secondary | ICD-10-CM | POA: Diagnosis not present

## 2022-06-19 DIAGNOSIS — D2371 Other benign neoplasm of skin of right lower limb, including hip: Secondary | ICD-10-CM | POA: Diagnosis not present

## 2022-06-26 IMAGING — MG DIGITAL SCREENING BILAT W/ TOMO W/ CAD
6 of 10 series · 6 of 30 positions shown · non-contrast
Comparison: None.

CLINICAL DATA: Screening.

EXAM:
DIGITAL SCREENING BILATERAL MAMMOGRAM WITH TOMO AND CAD

[L CC synth-2D]
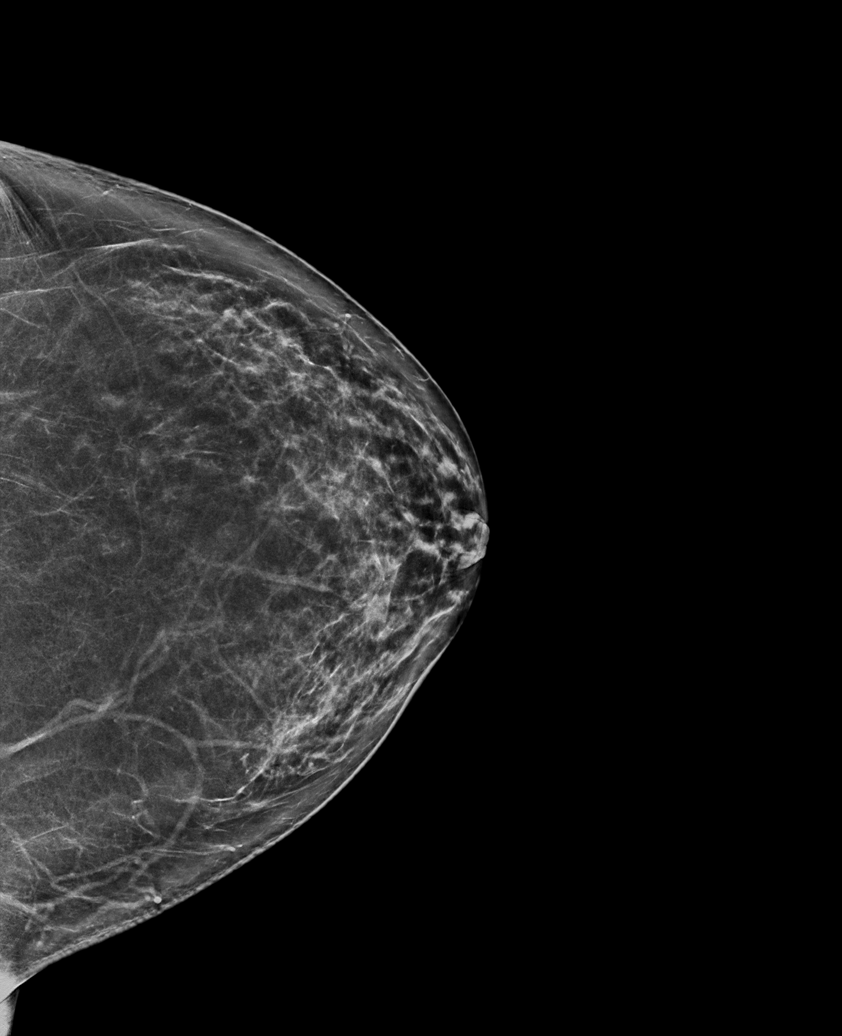

[R MLO synth-2D (1 of 2)]
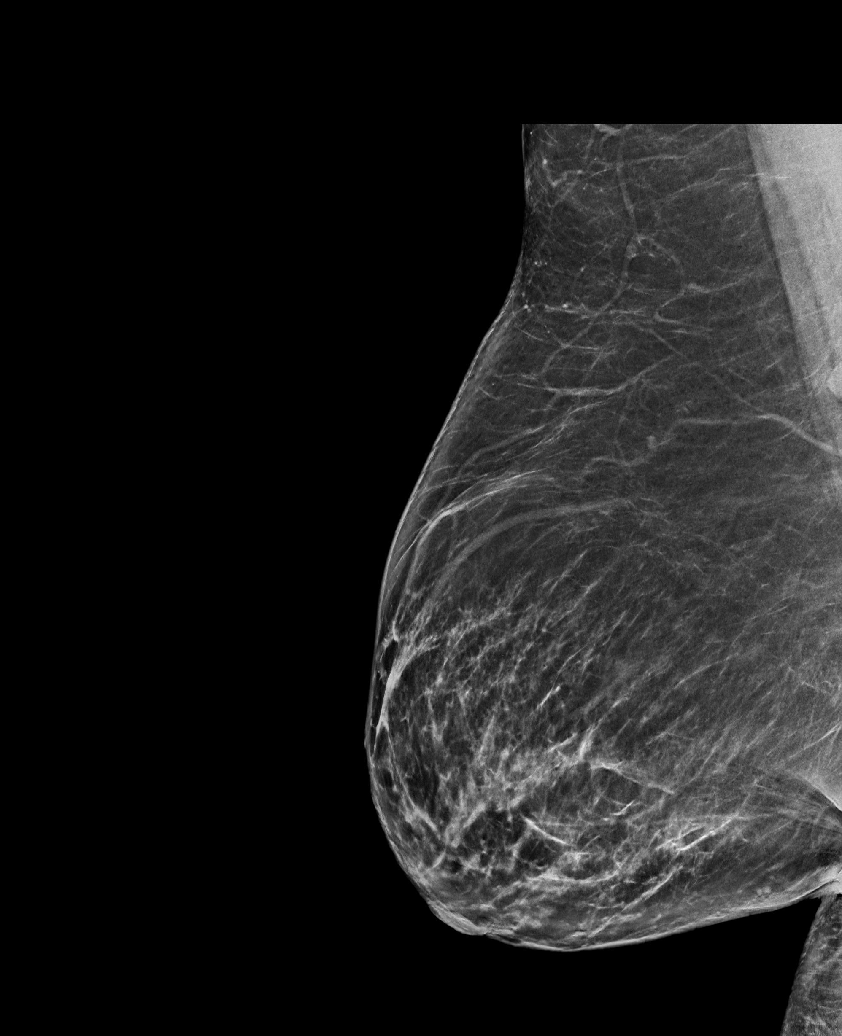

[L MLO synth-2D]
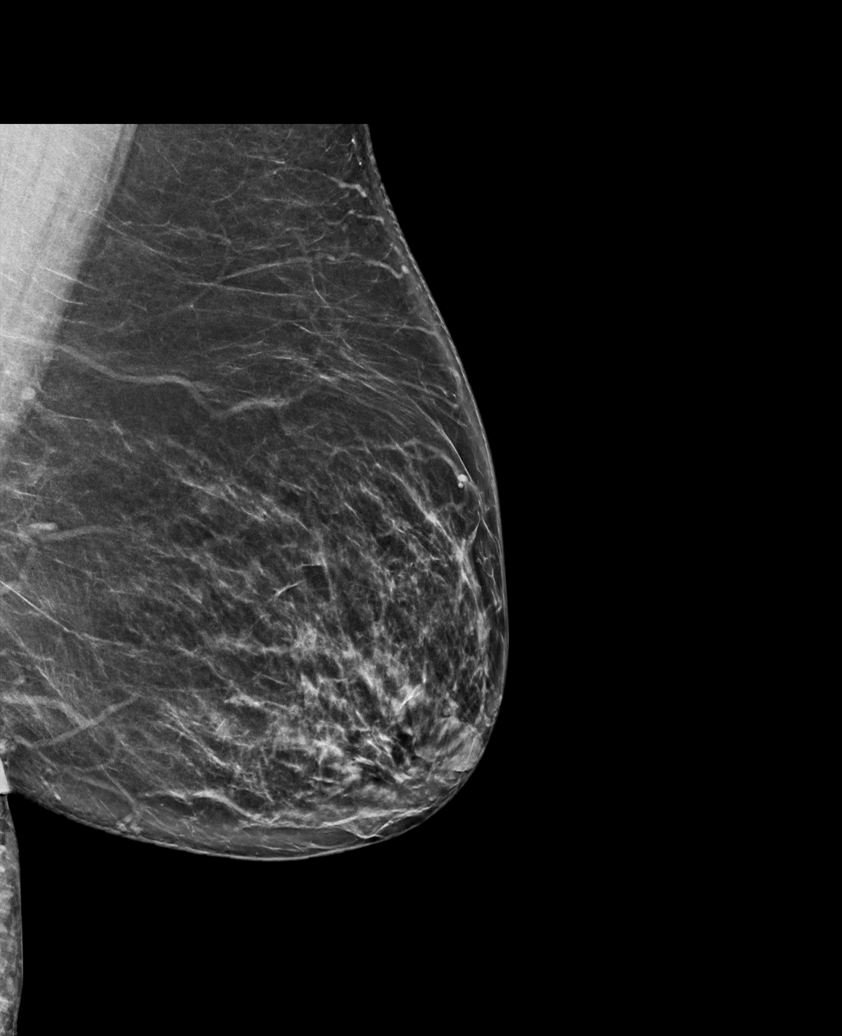

[R MLO synth-2D (2 of 2)]
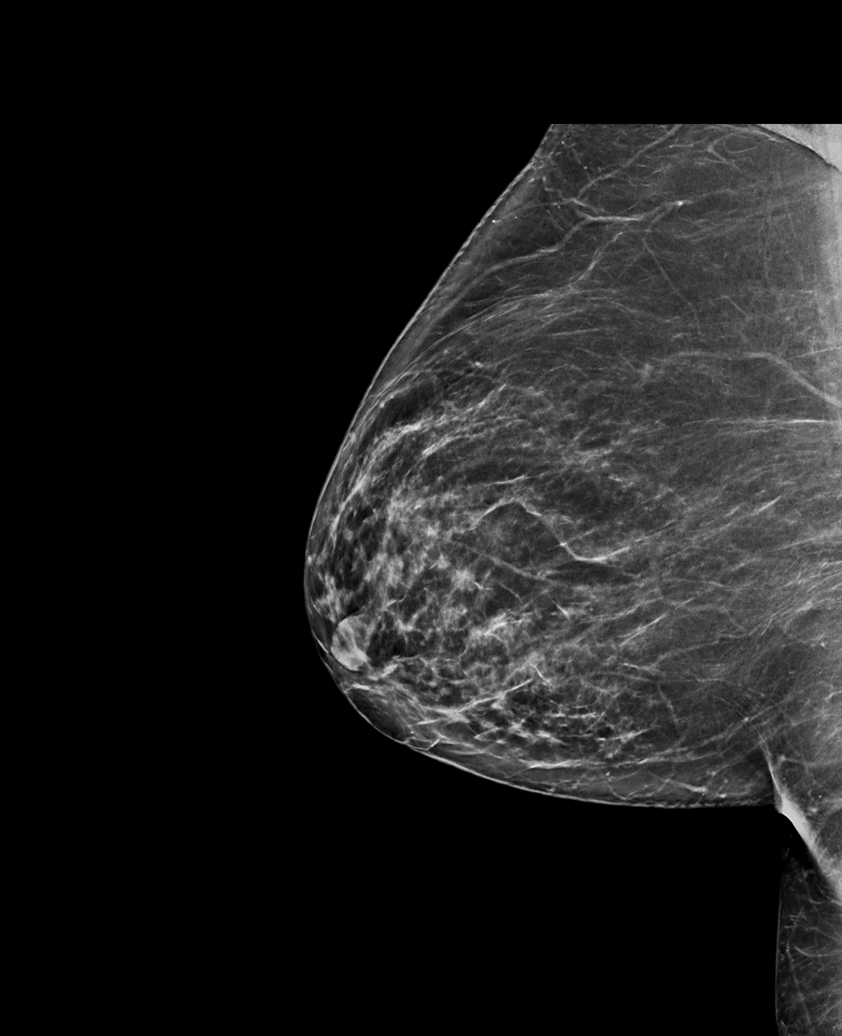

[R CC synth-2D]
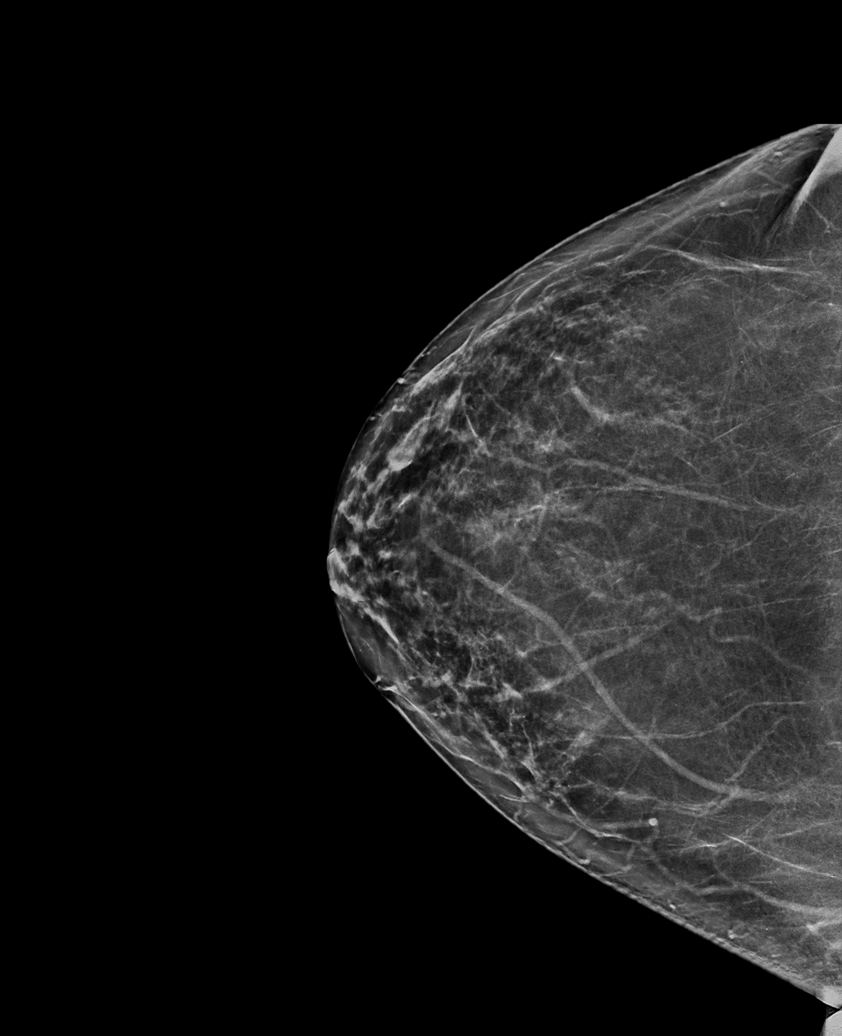

[R CC tomo · tomo slice 37/72.0]
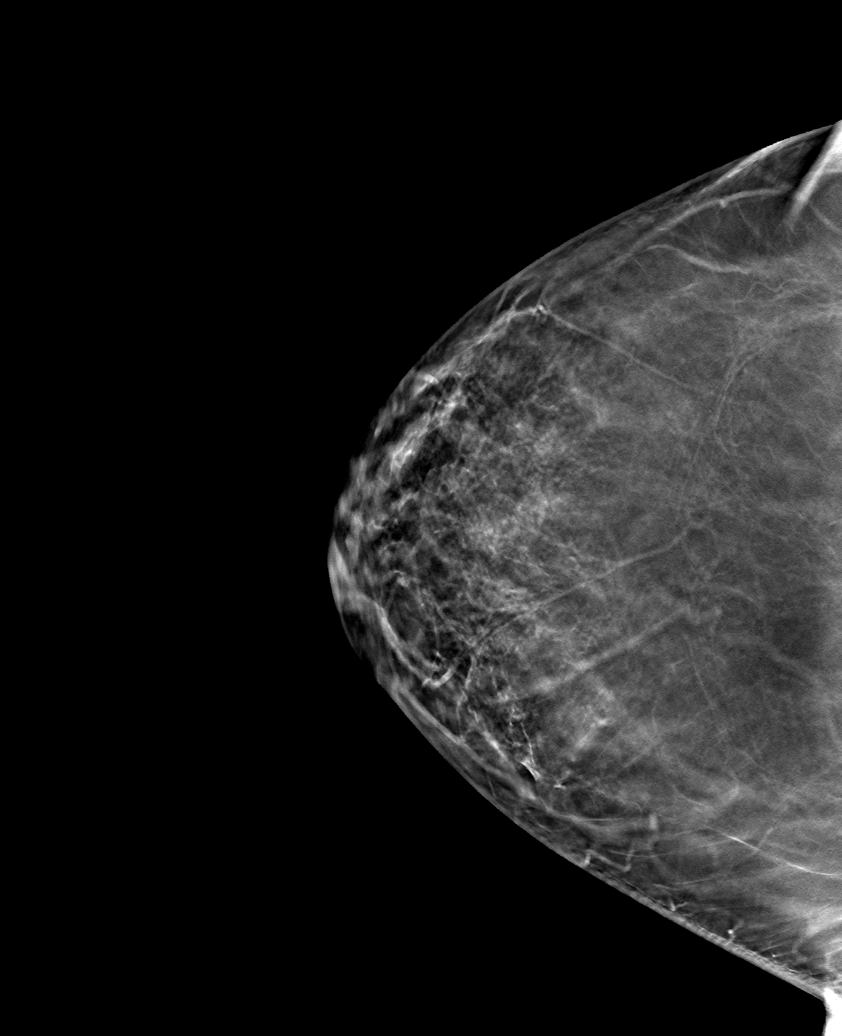

[6 of 30 positions shown; findings below may reference images not displayed]

ACR Breast Density Category b: There are scattered areas of
fibroglandular density.
FINDINGS: There are no findings suspicious for malignancy. Images were
processed with CAD.
IMPRESSION: No mammographic evidence of malignancy. A result letter of this
screening mammogram will be mailed directly to the patient.

RECOMMENDATION:
Screening mammogram in one year. (Code:Y5-G-EJ6)

BI-RADS CATEGORY  1: Negative.

## 2022-07-10 DIAGNOSIS — J029 Acute pharyngitis, unspecified: Secondary | ICD-10-CM | POA: Diagnosis not present

## 2022-07-10 DIAGNOSIS — Z1331 Encounter for screening for depression: Secondary | ICD-10-CM | POA: Diagnosis not present

## 2022-07-10 DIAGNOSIS — Z20818 Contact with and (suspected) exposure to other bacterial communicable diseases: Secondary | ICD-10-CM | POA: Diagnosis not present

## 2022-07-13 DIAGNOSIS — G4733 Obstructive sleep apnea (adult) (pediatric): Secondary | ICD-10-CM | POA: Diagnosis not present

## 2022-08-28 DIAGNOSIS — R92333 Mammographic heterogeneous density, bilateral breasts: Secondary | ICD-10-CM | POA: Diagnosis not present

## 2022-08-28 DIAGNOSIS — Z1231 Encounter for screening mammogram for malignant neoplasm of breast: Secondary | ICD-10-CM | POA: Diagnosis not present

## 2022-10-12 DIAGNOSIS — W448XXA Other foreign body entering into or through a natural orifice, initial encounter: Secondary | ICD-10-CM | POA: Diagnosis not present

## 2022-10-12 DIAGNOSIS — R350 Frequency of micturition: Secondary | ICD-10-CM | POA: Diagnosis not present

## 2022-10-12 DIAGNOSIS — T192XXA Foreign body in vulva and vagina, initial encounter: Secondary | ICD-10-CM | POA: Diagnosis not present

## 2022-10-15 DIAGNOSIS — G4733 Obstructive sleep apnea (adult) (pediatric): Secondary | ICD-10-CM | POA: Diagnosis not present

## 2022-11-06 DIAGNOSIS — J069 Acute upper respiratory infection, unspecified: Secondary | ICD-10-CM | POA: Diagnosis not present

## 2022-11-06 DIAGNOSIS — D0359 Melanoma in situ of other part of trunk: Secondary | ICD-10-CM | POA: Diagnosis not present

## 2023-01-15 DIAGNOSIS — G4733 Obstructive sleep apnea (adult) (pediatric): Secondary | ICD-10-CM | POA: Diagnosis not present

## 2023-01-27 DIAGNOSIS — R300 Dysuria: Secondary | ICD-10-CM | POA: Diagnosis not present

## 2023-01-27 DIAGNOSIS — R350 Frequency of micturition: Secondary | ICD-10-CM | POA: Diagnosis not present

## 2023-01-30 DIAGNOSIS — N898 Other specified noninflammatory disorders of vagina: Secondary | ICD-10-CM | POA: Diagnosis not present

## 2023-01-30 DIAGNOSIS — R3911 Hesitancy of micturition: Secondary | ICD-10-CM | POA: Diagnosis not present

## 2023-02-25 DIAGNOSIS — R232 Flushing: Secondary | ICD-10-CM | POA: Diagnosis not present

## 2023-02-25 DIAGNOSIS — N760 Acute vaginitis: Secondary | ICD-10-CM | POA: Diagnosis not present

## 2023-02-25 DIAGNOSIS — Z1331 Encounter for screening for depression: Secondary | ICD-10-CM | POA: Diagnosis not present

## 2023-02-25 DIAGNOSIS — R350 Frequency of micturition: Secondary | ICD-10-CM | POA: Diagnosis not present

## 2023-02-25 DIAGNOSIS — N852 Hypertrophy of uterus: Secondary | ICD-10-CM | POA: Diagnosis not present

## 2023-03-03 DIAGNOSIS — N83202 Unspecified ovarian cyst, left side: Secondary | ICD-10-CM | POA: Diagnosis not present

## 2023-03-03 DIAGNOSIS — N852 Hypertrophy of uterus: Secondary | ICD-10-CM | POA: Diagnosis not present

## 2023-03-09 DIAGNOSIS — D229 Melanocytic nevi, unspecified: Secondary | ICD-10-CM | POA: Diagnosis not present

## 2023-03-09 DIAGNOSIS — Z85820 Personal history of malignant melanoma of skin: Secondary | ICD-10-CM | POA: Diagnosis not present

## 2023-03-09 DIAGNOSIS — Z1283 Encounter for screening for malignant neoplasm of skin: Secondary | ICD-10-CM | POA: Diagnosis not present

## 2023-03-09 DIAGNOSIS — L578 Other skin changes due to chronic exposure to nonionizing radiation: Secondary | ICD-10-CM | POA: Diagnosis not present

## 2023-03-18 DIAGNOSIS — N83209 Unspecified ovarian cyst, unspecified side: Secondary | ICD-10-CM | POA: Diagnosis not present

## 2023-03-18 DIAGNOSIS — R350 Frequency of micturition: Secondary | ICD-10-CM | POA: Diagnosis not present

## 2023-03-18 DIAGNOSIS — R61 Generalized hyperhidrosis: Secondary | ICD-10-CM | POA: Diagnosis not present

## 2023-03-23 DIAGNOSIS — Z0000 Encounter for general adult medical examination without abnormal findings: Secondary | ICD-10-CM | POA: Diagnosis not present

## 2023-03-23 DIAGNOSIS — R61 Generalized hyperhidrosis: Secondary | ICD-10-CM | POA: Diagnosis not present

## 2023-03-23 DIAGNOSIS — D0359 Melanoma in situ of other part of trunk: Secondary | ICD-10-CM | POA: Diagnosis not present

## 2023-03-23 DIAGNOSIS — Z1211 Encounter for screening for malignant neoplasm of colon: Secondary | ICD-10-CM | POA: Diagnosis not present

## 2023-03-23 DIAGNOSIS — R3911 Hesitancy of micturition: Secondary | ICD-10-CM | POA: Diagnosis not present

## 2023-03-26 DIAGNOSIS — R350 Frequency of micturition: Secondary | ICD-10-CM | POA: Diagnosis not present

## 2023-03-26 DIAGNOSIS — R3915 Urgency of urination: Secondary | ICD-10-CM | POA: Diagnosis not present

## 2023-04-27 DIAGNOSIS — R030 Elevated blood-pressure reading, without diagnosis of hypertension: Secondary | ICD-10-CM | POA: Diagnosis not present

## 2023-04-27 DIAGNOSIS — E559 Vitamin D deficiency, unspecified: Secondary | ICD-10-CM | POA: Diagnosis not present

## 2023-04-27 DIAGNOSIS — R7309 Other abnormal glucose: Secondary | ICD-10-CM | POA: Diagnosis not present

## 2023-04-27 DIAGNOSIS — G4733 Obstructive sleep apnea (adult) (pediatric): Secondary | ICD-10-CM | POA: Diagnosis not present

## 2023-04-27 DIAGNOSIS — R635 Abnormal weight gain: Secondary | ICD-10-CM | POA: Diagnosis not present

## 2023-04-27 DIAGNOSIS — D508 Other iron deficiency anemias: Secondary | ICD-10-CM | POA: Diagnosis not present

## 2023-04-27 DIAGNOSIS — Z13220 Encounter for screening for lipoid disorders: Secondary | ICD-10-CM | POA: Diagnosis not present

## 2023-04-27 DIAGNOSIS — E213 Hyperparathyroidism, unspecified: Secondary | ICD-10-CM | POA: Diagnosis not present

## 2023-04-27 DIAGNOSIS — Z1389 Encounter for screening for other disorder: Secondary | ICD-10-CM | POA: Diagnosis not present

## 2023-05-11 DIAGNOSIS — E559 Vitamin D deficiency, unspecified: Secondary | ICD-10-CM | POA: Diagnosis not present

## 2023-05-11 DIAGNOSIS — E213 Hyperparathyroidism, unspecified: Secondary | ICD-10-CM | POA: Diagnosis not present

## 2023-05-11 DIAGNOSIS — G4733 Obstructive sleep apnea (adult) (pediatric): Secondary | ICD-10-CM | POA: Diagnosis not present

## 2023-05-11 DIAGNOSIS — R7309 Other abnormal glucose: Secondary | ICD-10-CM | POA: Diagnosis not present

## 2023-05-20 DIAGNOSIS — Z7189 Other specified counseling: Secondary | ICD-10-CM | POA: Diagnosis not present

## 2023-05-20 DIAGNOSIS — Z6841 Body Mass Index (BMI) 40.0 and over, adult: Secondary | ICD-10-CM | POA: Diagnosis not present

## 2023-05-26 DIAGNOSIS — D508 Other iron deficiency anemias: Secondary | ICD-10-CM | POA: Diagnosis not present

## 2023-05-26 DIAGNOSIS — E559 Vitamin D deficiency, unspecified: Secondary | ICD-10-CM | POA: Diagnosis not present

## 2023-05-26 DIAGNOSIS — G4733 Obstructive sleep apnea (adult) (pediatric): Secondary | ICD-10-CM | POA: Diagnosis not present

## 2023-05-26 DIAGNOSIS — E88819 Insulin resistance, unspecified: Secondary | ICD-10-CM | POA: Diagnosis not present

## 2023-06-30 DIAGNOSIS — D508 Other iron deficiency anemias: Secondary | ICD-10-CM | POA: Diagnosis not present

## 2023-06-30 DIAGNOSIS — E559 Vitamin D deficiency, unspecified: Secondary | ICD-10-CM | POA: Diagnosis not present

## 2023-06-30 DIAGNOSIS — E88819 Insulin resistance, unspecified: Secondary | ICD-10-CM | POA: Diagnosis not present

## 2023-06-30 DIAGNOSIS — G4733 Obstructive sleep apnea (adult) (pediatric): Secondary | ICD-10-CM | POA: Diagnosis not present

## 2023-07-29 DIAGNOSIS — D508 Other iron deficiency anemias: Secondary | ICD-10-CM | POA: Diagnosis not present

## 2023-07-29 DIAGNOSIS — E88819 Insulin resistance, unspecified: Secondary | ICD-10-CM | POA: Diagnosis not present

## 2023-07-29 DIAGNOSIS — E559 Vitamin D deficiency, unspecified: Secondary | ICD-10-CM | POA: Diagnosis not present

## 2023-07-29 DIAGNOSIS — E213 Hyperparathyroidism, unspecified: Secondary | ICD-10-CM | POA: Diagnosis not present

## 2023-09-07 DIAGNOSIS — E559 Vitamin D deficiency, unspecified: Secondary | ICD-10-CM | POA: Diagnosis not present

## 2023-09-07 DIAGNOSIS — K5903 Drug induced constipation: Secondary | ICD-10-CM | POA: Diagnosis not present

## 2023-09-07 DIAGNOSIS — G4733 Obstructive sleep apnea (adult) (pediatric): Secondary | ICD-10-CM | POA: Diagnosis not present

## 2023-09-07 DIAGNOSIS — D508 Other iron deficiency anemias: Secondary | ICD-10-CM | POA: Diagnosis not present

## 2023-10-15 DIAGNOSIS — G4733 Obstructive sleep apnea (adult) (pediatric): Secondary | ICD-10-CM | POA: Diagnosis not present

## 2023-10-15 DIAGNOSIS — E559 Vitamin D deficiency, unspecified: Secondary | ICD-10-CM | POA: Diagnosis not present

## 2023-10-15 DIAGNOSIS — D508 Other iron deficiency anemias: Secondary | ICD-10-CM | POA: Diagnosis not present

## 2023-10-15 DIAGNOSIS — K5903 Drug induced constipation: Secondary | ICD-10-CM | POA: Diagnosis not present

## 2023-11-23 DIAGNOSIS — E559 Vitamin D deficiency, unspecified: Secondary | ICD-10-CM | POA: Diagnosis not present

## 2023-11-23 DIAGNOSIS — K5903 Drug induced constipation: Secondary | ICD-10-CM | POA: Diagnosis not present

## 2023-11-23 DIAGNOSIS — G4733 Obstructive sleep apnea (adult) (pediatric): Secondary | ICD-10-CM | POA: Diagnosis not present

## 2023-11-23 DIAGNOSIS — D508 Other iron deficiency anemias: Secondary | ICD-10-CM | POA: Diagnosis not present

## 2023-12-08 ENCOUNTER — Other Ambulatory Visit: Payer: Self-pay | Admitting: Internal Medicine

## 2023-12-08 DIAGNOSIS — Z1231 Encounter for screening mammogram for malignant neoplasm of breast: Secondary | ICD-10-CM

## 2023-12-18 DIAGNOSIS — M1611 Unilateral primary osteoarthritis, right hip: Secondary | ICD-10-CM | POA: Diagnosis not present

## 2023-12-30 ENCOUNTER — Ambulatory Visit
Admission: RE | Admit: 2023-12-30 | Discharge: 2023-12-30 | Disposition: A | Source: Ambulatory Visit | Attending: Internal Medicine | Admitting: Internal Medicine

## 2023-12-30 DIAGNOSIS — Z1231 Encounter for screening mammogram for malignant neoplasm of breast: Secondary | ICD-10-CM
